# Patient Record
Sex: Female | Born: 1955 | Race: White | Hispanic: Yes | Marital: Single | Smoking: Never smoker
Health system: Southern US, Community
[De-identification: ages and names within clinical notes are randomized; demographics above are authoritative.]

---

## 2005-07-09 ENCOUNTER — Encounter: Admission: RE | Admit: 2005-07-09 | Discharge: 2005-07-09 | Payer: Self-pay | Admitting: Obstetrics and Gynecology

## 2006-10-15 ENCOUNTER — Encounter: Admission: RE | Admit: 2006-10-15 | Discharge: 2006-10-15 | Payer: Self-pay | Admitting: Family Medicine

## 2007-07-15 ENCOUNTER — Emergency Department (HOSPITAL_COMMUNITY): Admission: EM | Admit: 2007-07-15 | Discharge: 2007-07-16 | Payer: Self-pay | Admitting: Emergency Medicine

## 2007-10-16 ENCOUNTER — Encounter: Admission: RE | Admit: 2007-10-16 | Discharge: 2007-10-16 | Payer: Self-pay | Admitting: Family Medicine

## 2008-11-22 ENCOUNTER — Encounter: Admission: RE | Admit: 2008-11-22 | Discharge: 2008-11-22 | Payer: Self-pay | Admitting: Family Medicine

## 2010-05-14 ENCOUNTER — Encounter: Payer: Self-pay | Admitting: Family Medicine

## 2010-06-22 ENCOUNTER — Ambulatory Visit: Payer: Self-pay | Admitting: Family Medicine

## 2010-06-23 ENCOUNTER — Ambulatory Visit: Payer: Self-pay | Admitting: Family Medicine

## 2010-12-21 ENCOUNTER — Other Ambulatory Visit: Payer: Self-pay | Admitting: Family Medicine

## 2010-12-21 DIAGNOSIS — Z1231 Encounter for screening mammogram for malignant neoplasm of breast: Secondary | ICD-10-CM

## 2011-01-08 ENCOUNTER — Ambulatory Visit
Admission: RE | Admit: 2011-01-08 | Discharge: 2011-01-08 | Disposition: A | Discharge status: Home / Self Care | Payer: Federal, State, Local not specified - PPO | Source: Ambulatory Visit | Attending: Family Medicine | Admitting: Family Medicine

## 2011-01-08 DIAGNOSIS — Z1231 Encounter for screening mammogram for malignant neoplasm of breast: Secondary | ICD-10-CM

## 2012-01-08 ENCOUNTER — Other Ambulatory Visit: Payer: Self-pay | Admitting: Family Medicine

## 2012-01-08 DIAGNOSIS — Z1231 Encounter for screening mammogram for malignant neoplasm of breast: Secondary | ICD-10-CM

## 2012-01-21 ENCOUNTER — Ambulatory Visit: Payer: Federal, State, Local not specified - PPO

## 2012-02-15 ENCOUNTER — Ambulatory Visit
Admission: RE | Admit: 2012-02-15 | Discharge: 2012-02-15 | Disposition: A | Discharge status: Home / Self Care | Payer: Federal, State, Local not specified - PPO | Source: Ambulatory Visit | Attending: Family Medicine | Admitting: Family Medicine

## 2012-02-15 DIAGNOSIS — Z1231 Encounter for screening mammogram for malignant neoplasm of breast: Secondary | ICD-10-CM

## 2012-03-17 ENCOUNTER — Ambulatory Visit: Payer: Federal, State, Local not specified - PPO

## 2013-01-22 ENCOUNTER — Other Ambulatory Visit: Payer: Self-pay

## 2013-01-22 DIAGNOSIS — Z1231 Encounter for screening mammogram for malignant neoplasm of breast: Secondary | ICD-10-CM

## 2013-02-16 ENCOUNTER — Ambulatory Visit
Admission: RE | Admit: 2013-02-16 | Discharge: 2013-02-16 | Disposition: A | Discharge status: Home / Self Care | Payer: Federal, State, Local not specified - PPO | Source: Ambulatory Visit

## 2013-02-16 DIAGNOSIS — Z1231 Encounter for screening mammogram for malignant neoplasm of breast: Secondary | ICD-10-CM

## 2013-12-21 ENCOUNTER — Other Ambulatory Visit (HOSPITAL_COMMUNITY)
Admission: RE | Admit: 2013-12-21 | Discharge: 2013-12-21 | Disposition: A | Discharge status: Home / Self Care | Payer: Federal, State, Local not specified - PPO | Source: Ambulatory Visit | Attending: Family Medicine | Admitting: Family Medicine

## 2013-12-21 DIAGNOSIS — Z1151 Encounter for screening for human papillomavirus (HPV): Secondary | ICD-10-CM | POA: Insufficient documentation

## 2013-12-21 DIAGNOSIS — Z124 Encounter for screening for malignant neoplasm of cervix: Secondary | ICD-10-CM | POA: Diagnosis not present

## 2015-09-12 DIAGNOSIS — I10 Essential (primary) hypertension: Secondary | ICD-10-CM | POA: Diagnosis not present

## 2015-09-12 DIAGNOSIS — Z0000 Encounter for general adult medical examination without abnormal findings: Secondary | ICD-10-CM | POA: Diagnosis not present

## 2015-09-12 DIAGNOSIS — Z209 Contact with and (suspected) exposure to unspecified communicable disease: Secondary | ICD-10-CM | POA: Diagnosis not present

## 2015-09-12 DIAGNOSIS — Z23 Encounter for immunization: Secondary | ICD-10-CM | POA: Diagnosis not present

## 2015-09-12 DIAGNOSIS — G43009 Migraine without aura, not intractable, without status migrainosus: Secondary | ICD-10-CM | POA: Diagnosis not present

## 2015-09-12 DIAGNOSIS — Z1211 Encounter for screening for malignant neoplasm of colon: Secondary | ICD-10-CM | POA: Diagnosis not present

## 2015-09-14 ENCOUNTER — Other Ambulatory Visit: Payer: Self-pay

## 2015-09-14 DIAGNOSIS — Z1231 Encounter for screening mammogram for malignant neoplasm of breast: Secondary | ICD-10-CM

## 2015-09-26 ENCOUNTER — Ambulatory Visit: Payer: Federal, State, Local not specified - PPO

## 2015-10-16 DIAGNOSIS — R300 Dysuria: Secondary | ICD-10-CM | POA: Diagnosis not present

## 2015-10-16 DIAGNOSIS — R1900 Intra-abdominal and pelvic swelling, mass and lump, unspecified site: Secondary | ICD-10-CM | POA: Diagnosis not present

## 2015-10-16 DIAGNOSIS — R140 Abdominal distension (gaseous): Secondary | ICD-10-CM | POA: Diagnosis not present

## 2015-10-17 ENCOUNTER — Other Ambulatory Visit: Payer: Self-pay | Admitting: Family Medicine

## 2015-10-18 ENCOUNTER — Other Ambulatory Visit: Payer: Self-pay | Admitting: Family Medicine

## 2015-10-18 DIAGNOSIS — R1900 Intra-abdominal and pelvic swelling, mass and lump, unspecified site: Secondary | ICD-10-CM

## 2015-10-31 ENCOUNTER — Ambulatory Visit
Admission: RE | Admit: 2015-10-31 | Discharge: 2015-10-31 | Disposition: A | Discharge status: Home / Self Care | Payer: Federal, State, Local not specified - PPO | Source: Ambulatory Visit

## 2015-10-31 ENCOUNTER — Other Ambulatory Visit: Payer: Self-pay | Admitting: Family Medicine

## 2015-10-31 ENCOUNTER — Other Ambulatory Visit: Payer: Federal, State, Local not specified - PPO

## 2015-10-31 DIAGNOSIS — Z1231 Encounter for screening mammogram for malignant neoplasm of breast: Secondary | ICD-10-CM

## 2015-11-07 ENCOUNTER — Ambulatory Visit
Admission: RE | Admit: 2015-11-07 | Discharge: 2015-11-07 | Disposition: A | Discharge status: Home / Self Care | Payer: Federal, State, Local not specified - PPO | Source: Ambulatory Visit | Attending: Family Medicine | Admitting: Family Medicine

## 2015-11-07 DIAGNOSIS — K5730 Diverticulosis of large intestine without perforation or abscess without bleeding: Secondary | ICD-10-CM | POA: Diagnosis not present

## 2015-11-07 DIAGNOSIS — R1900 Intra-abdominal and pelvic swelling, mass and lump, unspecified site: Secondary | ICD-10-CM

## 2015-11-07 MED ORDER — IOPAMIDOL (ISOVUE-300) INJECTION 61%
100.0000 mL | Freq: Once | INTRAVENOUS | Status: AC | PRN
  Administered 2015-11-07: 100 mL via INTRAVENOUS

## 2015-11-14 ENCOUNTER — Other Ambulatory Visit: Payer: Self-pay | Admitting: Family Medicine

## 2015-11-14 DIAGNOSIS — K769 Liver disease, unspecified: Secondary | ICD-10-CM

## 2015-12-19 ENCOUNTER — Other Ambulatory Visit: Payer: Federal, State, Local not specified - PPO

## 2016-01-16 ENCOUNTER — Ambulatory Visit
Admission: RE | Admit: 2016-01-16 | Discharge: 2016-01-16 | Disposition: A | Discharge status: Home / Self Care | Payer: Federal, State, Local not specified - PPO | Source: Ambulatory Visit | Attending: Family Medicine | Admitting: Family Medicine

## 2016-01-16 DIAGNOSIS — K769 Liver disease, unspecified: Secondary | ICD-10-CM

## 2016-01-16 DIAGNOSIS — K7689 Other specified diseases of liver: Secondary | ICD-10-CM | POA: Diagnosis not present

## 2016-01-16 MED ORDER — GADOBENATE DIMEGLUMINE 529 MG/ML IV SOLN
16.0000 mL | Freq: Once | INTRAVENOUS | Status: AC | PRN
  Administered 2016-01-16: 16 mL via INTRAVENOUS

## 2016-01-20 DIAGNOSIS — K769 Liver disease, unspecified: Secondary | ICD-10-CM | POA: Diagnosis not present

## 2016-03-05 DIAGNOSIS — I10 Essential (primary) hypertension: Secondary | ICD-10-CM | POA: Diagnosis not present

## 2016-03-05 DIAGNOSIS — E7800 Pure hypercholesterolemia, unspecified: Secondary | ICD-10-CM | POA: Diagnosis not present

## 2016-03-05 DIAGNOSIS — E669 Obesity, unspecified: Secondary | ICD-10-CM | POA: Diagnosis not present

## 2016-03-05 DIAGNOSIS — G43009 Migraine without aura, not intractable, without status migrainosus: Secondary | ICD-10-CM | POA: Diagnosis not present

## 2016-03-05 DIAGNOSIS — R739 Hyperglycemia, unspecified: Secondary | ICD-10-CM | POA: Diagnosis not present

## 2016-06-18 DIAGNOSIS — G43009 Migraine without aura, not intractable, without status migrainosus: Secondary | ICD-10-CM | POA: Diagnosis not present

## 2016-06-18 DIAGNOSIS — I10 Essential (primary) hypertension: Secondary | ICD-10-CM | POA: Diagnosis not present

## 2016-06-18 DIAGNOSIS — M722 Plantar fascial fibromatosis: Secondary | ICD-10-CM | POA: Diagnosis not present

## 2016-09-10 DIAGNOSIS — R739 Hyperglycemia, unspecified: Secondary | ICD-10-CM | POA: Diagnosis not present

## 2016-09-10 DIAGNOSIS — M5416 Radiculopathy, lumbar region: Secondary | ICD-10-CM | POA: Diagnosis not present

## 2016-09-10 DIAGNOSIS — E7800 Pure hypercholesterolemia, unspecified: Secondary | ICD-10-CM | POA: Diagnosis not present

## 2016-09-10 DIAGNOSIS — I10 Essential (primary) hypertension: Secondary | ICD-10-CM | POA: Diagnosis not present

## 2016-09-24 DIAGNOSIS — M5431 Sciatica, right side: Secondary | ICD-10-CM | POA: Diagnosis not present

## 2016-09-24 DIAGNOSIS — L608 Other nail disorders: Secondary | ICD-10-CM | POA: Diagnosis not present

## 2016-10-19 ENCOUNTER — Other Ambulatory Visit: Payer: Self-pay | Admitting: Family Medicine

## 2016-10-19 DIAGNOSIS — Z1231 Encounter for screening mammogram for malignant neoplasm of breast: Secondary | ICD-10-CM

## 2016-11-05 DIAGNOSIS — I10 Essential (primary) hypertension: Secondary | ICD-10-CM | POA: Diagnosis not present

## 2016-11-05 DIAGNOSIS — E7800 Pure hypercholesterolemia, unspecified: Secondary | ICD-10-CM | POA: Diagnosis not present

## 2016-11-05 DIAGNOSIS — G43009 Migraine without aura, not intractable, without status migrainosus: Secondary | ICD-10-CM | POA: Diagnosis not present

## 2016-11-05 DIAGNOSIS — R739 Hyperglycemia, unspecified: Secondary | ICD-10-CM | POA: Diagnosis not present

## 2016-11-05 DIAGNOSIS — Z0000 Encounter for general adult medical examination without abnormal findings: Secondary | ICD-10-CM | POA: Diagnosis not present

## 2016-11-06 ENCOUNTER — Other Ambulatory Visit: Payer: Self-pay | Admitting: Family Medicine

## 2016-11-06 DIAGNOSIS — K769 Liver disease, unspecified: Secondary | ICD-10-CM

## 2016-11-12 ENCOUNTER — Ambulatory Visit: Payer: Federal, State, Local not specified - PPO

## 2016-11-27 ENCOUNTER — Other Ambulatory Visit: Payer: Federal, State, Local not specified - PPO

## 2016-12-03 ENCOUNTER — Ambulatory Visit
Admission: RE | Admit: 2016-12-03 | Discharge: 2016-12-03 | Disposition: A | Discharge status: Home / Self Care | Payer: Federal, State, Local not specified - PPO | Source: Ambulatory Visit | Attending: Family Medicine | Admitting: Family Medicine

## 2016-12-03 ENCOUNTER — Other Ambulatory Visit: Payer: Self-pay | Admitting: Family Medicine

## 2016-12-03 DIAGNOSIS — K769 Liver disease, unspecified: Secondary | ICD-10-CM

## 2016-12-03 DIAGNOSIS — Z1231 Encounter for screening mammogram for malignant neoplasm of breast: Secondary | ICD-10-CM

## 2016-12-10 DIAGNOSIS — M25561 Pain in right knee: Secondary | ICD-10-CM | POA: Diagnosis not present

## 2016-12-10 DIAGNOSIS — M5416 Radiculopathy, lumbar region: Secondary | ICD-10-CM | POA: Diagnosis not present

## 2016-12-10 DIAGNOSIS — Z6820 Body mass index (BMI) 20.0-20.9, adult: Secondary | ICD-10-CM | POA: Diagnosis not present

## 2016-12-19 DIAGNOSIS — K769 Liver disease, unspecified: Secondary | ICD-10-CM | POA: Diagnosis not present

## 2017-11-11 DIAGNOSIS — I10 Essential (primary) hypertension: Secondary | ICD-10-CM | POA: Diagnosis not present

## 2017-11-11 DIAGNOSIS — E7800 Pure hypercholesterolemia, unspecified: Secondary | ICD-10-CM | POA: Diagnosis not present

## 2017-11-11 DIAGNOSIS — R7303 Prediabetes: Secondary | ICD-10-CM | POA: Diagnosis not present

## 2017-11-27 DIAGNOSIS — E669 Obesity, unspecified: Secondary | ICD-10-CM | POA: Diagnosis not present

## 2017-11-27 DIAGNOSIS — I10 Essential (primary) hypertension: Secondary | ICD-10-CM | POA: Diagnosis not present

## 2017-11-27 DIAGNOSIS — G43009 Migraine without aura, not intractable, without status migrainosus: Secondary | ICD-10-CM | POA: Diagnosis not present

## 2017-11-27 DIAGNOSIS — I700 Atherosclerosis of aorta: Secondary | ICD-10-CM | POA: Diagnosis not present

## 2017-11-27 DIAGNOSIS — Z0000 Encounter for general adult medical examination without abnormal findings: Secondary | ICD-10-CM | POA: Diagnosis not present

## 2018-04-17 DIAGNOSIS — Z1231 Encounter for screening mammogram for malignant neoplasm of breast: Secondary | ICD-10-CM | POA: Diagnosis not present

## 2018-04-28 IMAGING — US US ABDOMEN LIMITED
1 series · 13 of 25 positions shown · non-contrast
Comparison: MRI 01/16/2016.  CT 11/07/2015 .

CLINICAL DATA: Liver lesion .

EXAM:
ULTRASOUND ABDOMEN LIMITED RIGHT UPPER QUADRANT

[Series 1: us abdomen limited · 0.13mm/px · 13 of 64 slices shown]
[im 1/64]
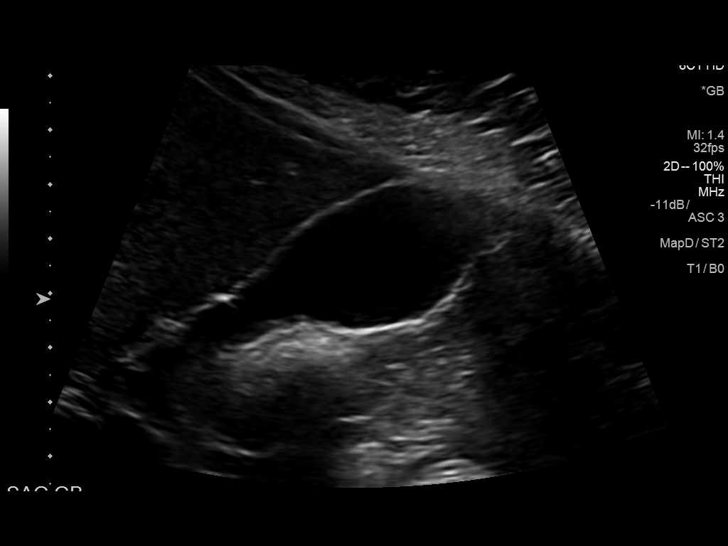
[im 6/64]
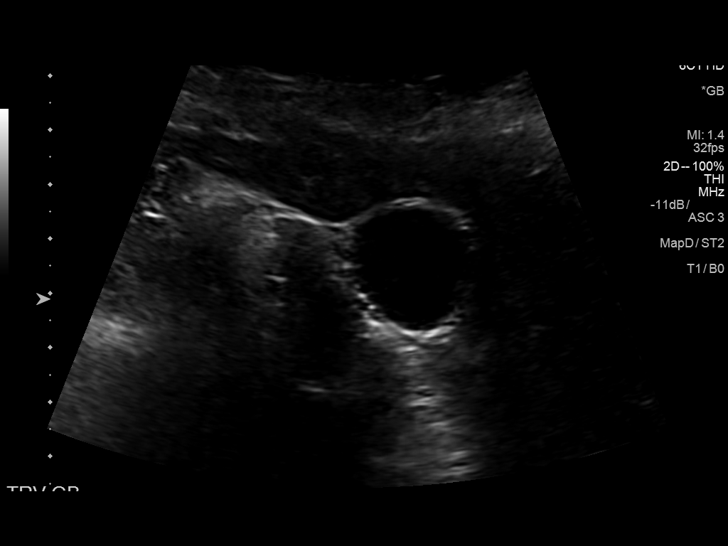
[im 11/64]
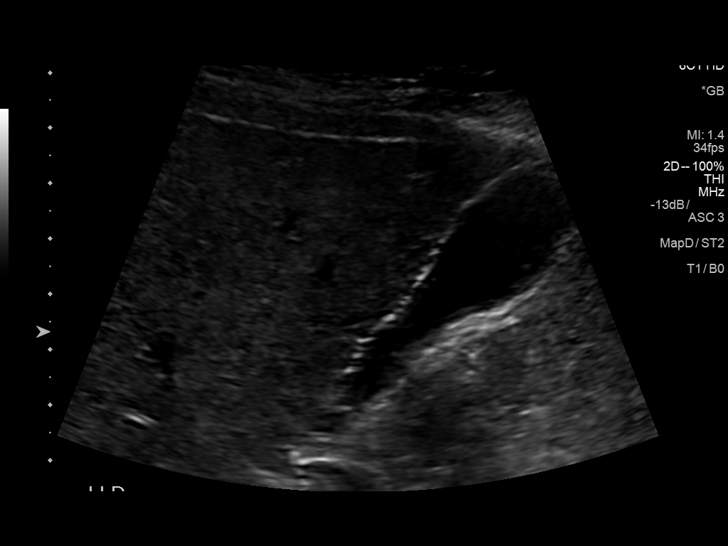
[im 16/64]
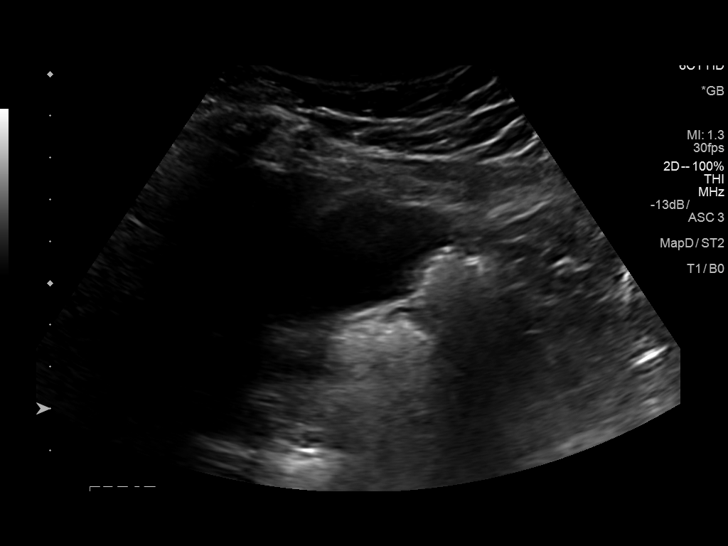
[im 22/64]
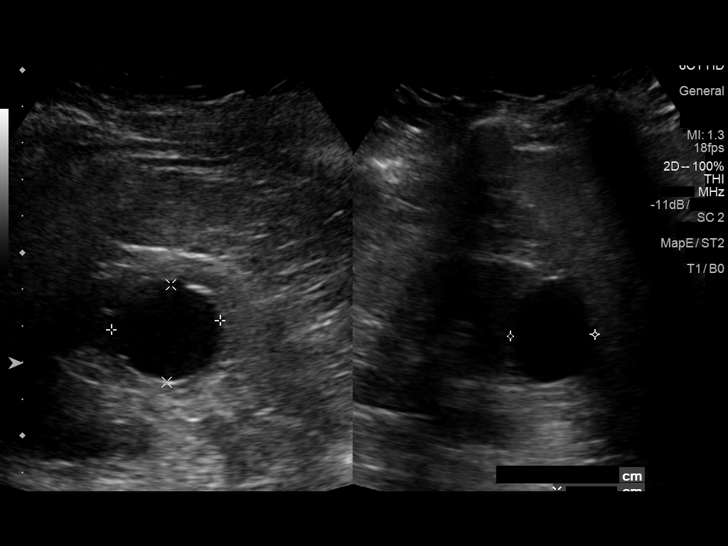
[im 27/64]
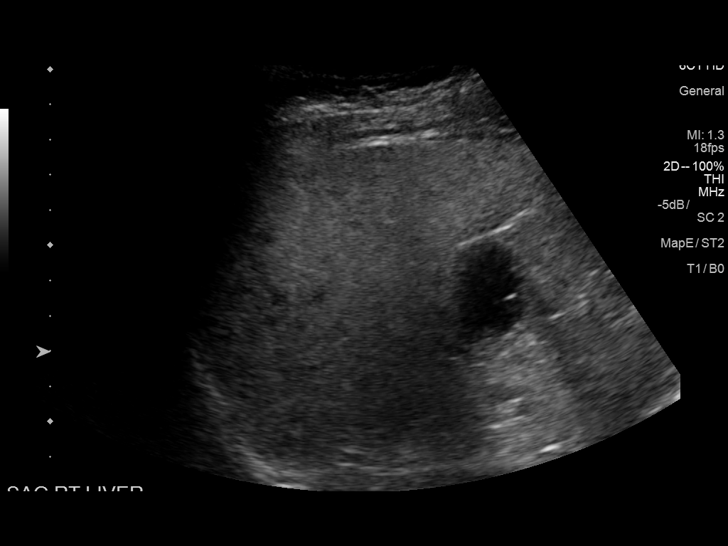
[im 32/64]
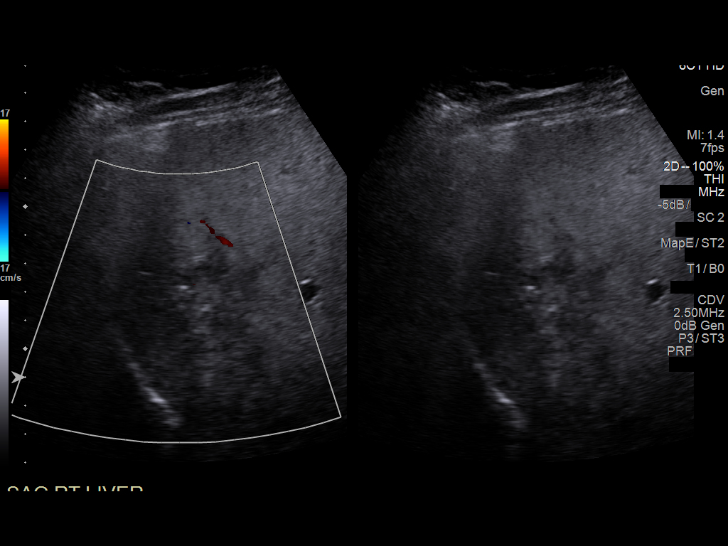
[im 37/64]
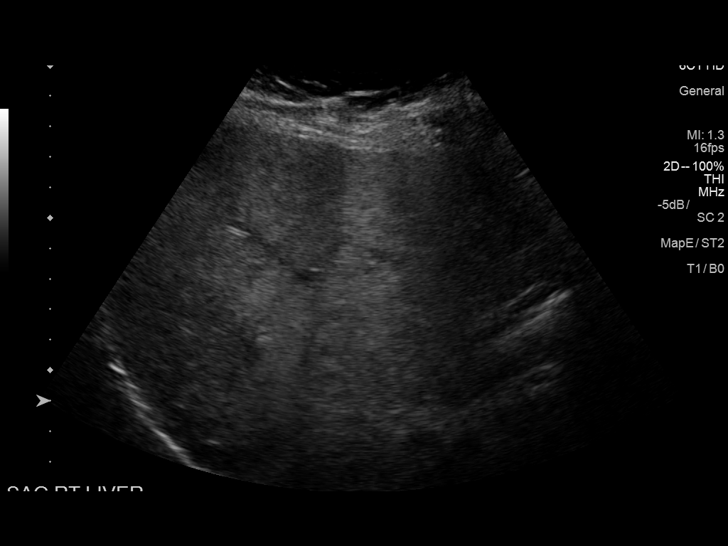
[im 43/64]
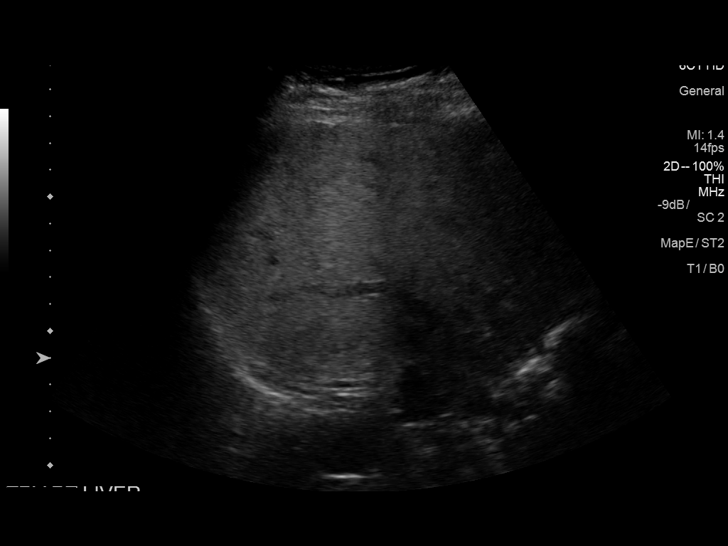
[im 48/64]
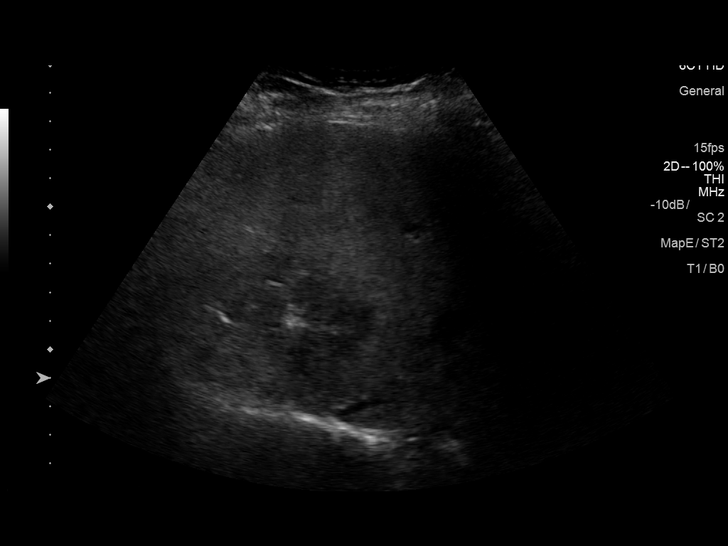
[im 53/64]
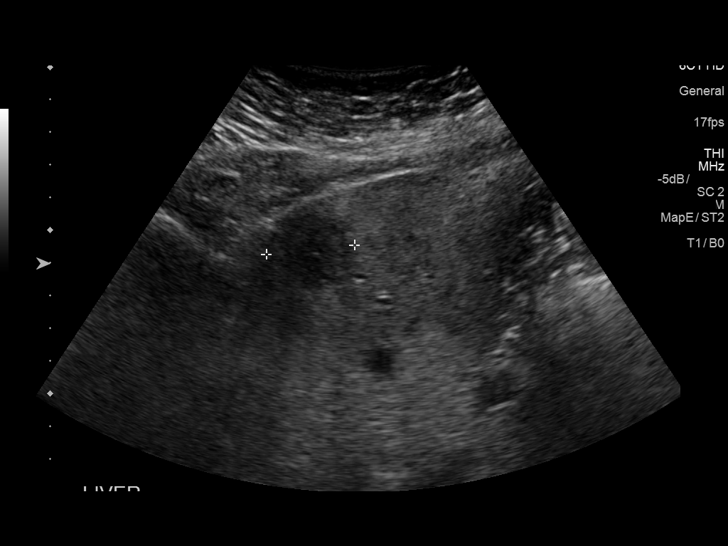
[im 58/64]
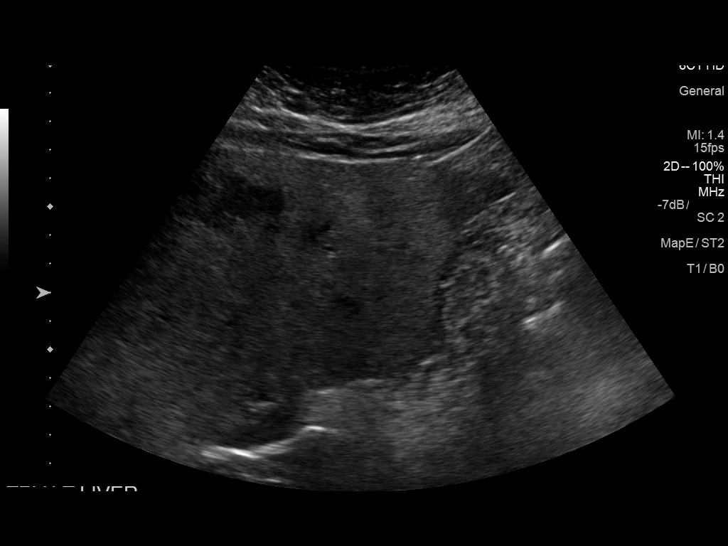
[im 64/64]
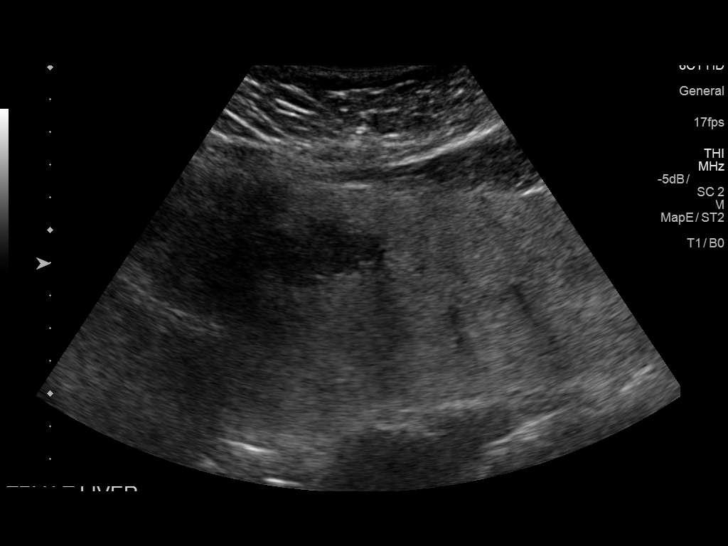

[13 of 25 positions shown; findings below may reference images not displayed]

FINDINGS: Gallbladder:

No gallstones or wall thickening visualized. No sonographic Murphy
sign noted by sonographer.

Common bile duct:

Diameter: 5.3 mm

Liver:

Liver has a heterogeneous parenchymal pattern suggesting fatty
infiltration and/or hepatocellular disease. A 5.7 x 5.9 x 5.6 cm
heterogeneous mass is again noted in the right lobe liver. Punctate
areas of increased echogenicity noted within the mass. Questionable
associated shadowing. Although these punctate areas may represent
fat, calcifications within the lesion cannot be excluded . No lesion
calcification previously noted on prior studies. A 2.7 x 4.2 x
cm heterogeneous mass is again noted in the left lobe of the liver.
No significant change in size of either lesion noted.
IMPRESSION: 1. A 5.7 x 5.9 x 5.6 cm heterogeneous mass is again in the right
lobe liver. On today's exam there punctate areas of increased
echogenicity within the mass. Associated questionable shadowing is
noted. Although these punctate areas of increase echogenicity may
represent fat, calcification within this lesion on today's exam
cannot be excluded. Again correlate with AFP levels. A follow-up
gadolinium-enhanced MRI can be obtained to compare to the prior MR
exam of 01/16/2016. [DATE] x 4.2 x 2.2 cm left hepatic lobe lesion is
again noted. No interim size change noted in either lesion.

2. The liver has a heterogeneous parenchymal pattern suggesting
fatty infiltration and/or hepatocellular disease.

3.  No gallstones or biliary distention.

## 2018-06-02 DIAGNOSIS — E7800 Pure hypercholesterolemia, unspecified: Secondary | ICD-10-CM | POA: Diagnosis not present

## 2018-06-02 DIAGNOSIS — G43009 Migraine without aura, not intractable, without status migrainosus: Secondary | ICD-10-CM | POA: Diagnosis not present

## 2018-06-02 DIAGNOSIS — R739 Hyperglycemia, unspecified: Secondary | ICD-10-CM | POA: Diagnosis not present

## 2018-06-02 DIAGNOSIS — I10 Essential (primary) hypertension: Secondary | ICD-10-CM | POA: Diagnosis not present

## 2018-06-05 DIAGNOSIS — Z1211 Encounter for screening for malignant neoplasm of colon: Secondary | ICD-10-CM | POA: Diagnosis not present

## 2018-12-22 ENCOUNTER — Other Ambulatory Visit: Payer: Self-pay | Admitting: Family Medicine

## 2018-12-22 ENCOUNTER — Other Ambulatory Visit (HOSPITAL_COMMUNITY)
Admission: RE | Admit: 2018-12-22 | Discharge: 2018-12-22 | Disposition: A | Discharge status: Home / Self Care | Payer: Federal, State, Local not specified - PPO | Source: Ambulatory Visit | Attending: Family Medicine | Admitting: Family Medicine

## 2018-12-22 DIAGNOSIS — Z0000 Encounter for general adult medical examination without abnormal findings: Secondary | ICD-10-CM | POA: Diagnosis not present

## 2018-12-22 DIAGNOSIS — E7800 Pure hypercholesterolemia, unspecified: Secondary | ICD-10-CM | POA: Diagnosis not present

## 2018-12-22 DIAGNOSIS — G43009 Migraine without aura, not intractable, without status migrainosus: Secondary | ICD-10-CM | POA: Diagnosis not present

## 2018-12-22 DIAGNOSIS — R739 Hyperglycemia, unspecified: Secondary | ICD-10-CM | POA: Diagnosis not present

## 2018-12-22 DIAGNOSIS — E669 Obesity, unspecified: Secondary | ICD-10-CM | POA: Diagnosis not present

## 2018-12-22 DIAGNOSIS — I10 Essential (primary) hypertension: Secondary | ICD-10-CM | POA: Diagnosis not present

## 2018-12-22 DIAGNOSIS — Z124 Encounter for screening for malignant neoplasm of cervix: Secondary | ICD-10-CM | POA: Diagnosis not present

## 2018-12-24 LAB — CYTOLOGY - PAP
Diagnosis: NEGATIVE
HPV: NOT DETECTED

## 2019-01-08 ENCOUNTER — Ambulatory Visit: Payer: Federal, State, Local not specified - PPO | Admitting: Podiatry

## 2019-01-09 ENCOUNTER — Ambulatory Visit: Payer: Federal, State, Local not specified - PPO | Admitting: Podiatry

## 2019-01-13 ENCOUNTER — Ambulatory Visit: Payer: Federal, State, Local not specified - PPO | Admitting: Podiatry

## 2019-01-26 ENCOUNTER — Other Ambulatory Visit: Payer: Self-pay

## 2019-01-26 ENCOUNTER — Ambulatory Visit: Payer: Federal, State, Local not specified - PPO | Admitting: Podiatry

## 2019-01-26 ENCOUNTER — Encounter: Payer: Self-pay | Admitting: Podiatry

## 2019-01-26 VITALS — BP 151/87

## 2019-01-26 DIAGNOSIS — L600 Ingrowing nail: Secondary | ICD-10-CM

## 2019-01-26 DIAGNOSIS — B351 Tinea unguium: Secondary | ICD-10-CM

## 2019-02-01 NOTE — Progress Notes (Signed)
Subjective:   Patient ID: Cynthia Kerr, female   DOB: 63 y.o.   MRN: [id number]   HPI Patient presents concerned about a cracking of the right hallux nail and whether or not this will get worse over time.  Patient does not smoke   Review of Systems  All other systems reviewed and are negative.       Objective:  Physical Exam Vitals signs and nursing note reviewed.  Constitutional:      Appearance: She is well-developed.  Pulmonary:     Effort: Pulmonary effort is normal.  Musculoskeletal: Normal range of motion.  Skin:    General: Skin is warm.  Neurological:     Mental Status: She is alert.     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  Patient will continue to allow it to grow out and see that it does not get her problems but is encouraged to come in if any issues were to occur

## 2019-06-09 DIAGNOSIS — M545 Low back pain: Secondary | ICD-10-CM | POA: Diagnosis not present

## 2019-07-09 DIAGNOSIS — T24299A Burn of second degree of multiple sites of unspecified lower limb, except ankle and foot, initial encounter: Secondary | ICD-10-CM | POA: Diagnosis not present

## 2019-07-13 DIAGNOSIS — T24299A Burn of second degree of multiple sites of unspecified lower limb, except ankle and foot, initial encounter: Secondary | ICD-10-CM | POA: Diagnosis not present

## 2019-07-16 DIAGNOSIS — T24299A Burn of second degree of multiple sites of unspecified lower limb, except ankle and foot, initial encounter: Secondary | ICD-10-CM | POA: Diagnosis not present

## 2019-09-07 ENCOUNTER — Other Ambulatory Visit: Payer: Self-pay | Admitting: Family Medicine

## 2019-09-07 DIAGNOSIS — Z1231 Encounter for screening mammogram for malignant neoplasm of breast: Secondary | ICD-10-CM

## 2019-09-11 ENCOUNTER — Ambulatory Visit: Payer: Federal, State, Local not specified - PPO

## 2019-09-11 DIAGNOSIS — Z1231 Encounter for screening mammogram for malignant neoplasm of breast: Secondary | ICD-10-CM | POA: Diagnosis not present

## 2020-01-04 DIAGNOSIS — Z0000 Encounter for general adult medical examination without abnormal findings: Secondary | ICD-10-CM | POA: Diagnosis not present

## 2020-01-04 DIAGNOSIS — E7800 Pure hypercholesterolemia, unspecified: Secondary | ICD-10-CM | POA: Diagnosis not present

## 2020-01-04 DIAGNOSIS — R7303 Prediabetes: Secondary | ICD-10-CM | POA: Diagnosis not present

## 2020-01-04 DIAGNOSIS — I700 Atherosclerosis of aorta: Secondary | ICD-10-CM | POA: Diagnosis not present

## 2020-01-04 DIAGNOSIS — G43009 Migraine without aura, not intractable, without status migrainosus: Secondary | ICD-10-CM | POA: Diagnosis not present

## 2020-01-04 DIAGNOSIS — I10 Essential (primary) hypertension: Secondary | ICD-10-CM | POA: Diagnosis not present

## 2020-01-05 ENCOUNTER — Other Ambulatory Visit: Payer: Self-pay | Admitting: Family Medicine

## 2020-01-05 DIAGNOSIS — K769 Liver disease, unspecified: Secondary | ICD-10-CM

## 2020-02-19 ENCOUNTER — Other Ambulatory Visit: Payer: Federal, State, Local not specified - PPO

## 2020-03-07 ENCOUNTER — Other Ambulatory Visit: Payer: Federal, State, Local not specified - PPO

## 2020-03-14 ENCOUNTER — Ambulatory Visit
Admission: RE | Admit: 2020-03-14 | Discharge: 2020-03-14 | Disposition: A | Discharge status: Home / Self Care | Payer: Federal, State, Local not specified - PPO | Source: Ambulatory Visit | Attending: Family Medicine | Admitting: Family Medicine

## 2020-03-14 DIAGNOSIS — K769 Liver disease, unspecified: Secondary | ICD-10-CM

## 2020-03-24 DIAGNOSIS — M5442 Lumbago with sciatica, left side: Secondary | ICD-10-CM | POA: Diagnosis not present

## 2020-03-24 DIAGNOSIS — M5441 Lumbago with sciatica, right side: Secondary | ICD-10-CM | POA: Diagnosis not present

## 2020-09-09 ENCOUNTER — Other Ambulatory Visit: Payer: Self-pay | Admitting: Family Medicine

## 2020-09-09 DIAGNOSIS — Z1231 Encounter for screening mammogram for malignant neoplasm of breast: Secondary | ICD-10-CM

## 2020-09-26 DIAGNOSIS — Z1231 Encounter for screening mammogram for malignant neoplasm of breast: Secondary | ICD-10-CM | POA: Diagnosis not present

## 2020-11-14 ENCOUNTER — Ambulatory Visit: Payer: Federal, State, Local not specified - PPO

## 2020-12-09 DIAGNOSIS — M545 Low back pain, unspecified: Secondary | ICD-10-CM | POA: Diagnosis not present

## 2020-12-09 DIAGNOSIS — I10 Essential (primary) hypertension: Secondary | ICD-10-CM | POA: Diagnosis not present

## 2021-01-16 DIAGNOSIS — G43009 Migraine without aura, not intractable, without status migrainosus: Secondary | ICD-10-CM | POA: Diagnosis not present

## 2021-01-16 DIAGNOSIS — I10 Essential (primary) hypertension: Secondary | ICD-10-CM | POA: Diagnosis not present

## 2021-01-16 DIAGNOSIS — E7800 Pure hypercholesterolemia, unspecified: Secondary | ICD-10-CM | POA: Diagnosis not present

## 2021-01-16 DIAGNOSIS — E669 Obesity, unspecified: Secondary | ICD-10-CM | POA: Diagnosis not present

## 2021-01-16 DIAGNOSIS — Z0000 Encounter for general adult medical examination without abnormal findings: Secondary | ICD-10-CM | POA: Diagnosis not present

## 2021-01-16 DIAGNOSIS — R7303 Prediabetes: Secondary | ICD-10-CM | POA: Diagnosis not present

## 2021-03-02 DIAGNOSIS — K635 Polyp of colon: Secondary | ICD-10-CM | POA: Diagnosis not present

## 2021-03-02 DIAGNOSIS — D123 Benign neoplasm of transverse colon: Secondary | ICD-10-CM | POA: Diagnosis not present

## 2021-03-02 DIAGNOSIS — I10 Essential (primary) hypertension: Secondary | ICD-10-CM | POA: Diagnosis not present

## 2021-03-02 DIAGNOSIS — Z1211 Encounter for screening for malignant neoplasm of colon: Secondary | ICD-10-CM | POA: Diagnosis not present

## 2021-03-02 DIAGNOSIS — K5730 Diverticulosis of large intestine without perforation or abscess without bleeding: Secondary | ICD-10-CM | POA: Diagnosis not present

## 2021-03-02 DIAGNOSIS — Z8371 Family history of colonic polyps: Secondary | ICD-10-CM | POA: Diagnosis not present

## 2021-10-11 DIAGNOSIS — Z1231 Encounter for screening mammogram for malignant neoplasm of breast: Secondary | ICD-10-CM | POA: Diagnosis not present

## 2021-10-19 DIAGNOSIS — R922 Inconclusive mammogram: Secondary | ICD-10-CM | POA: Diagnosis not present

## 2021-10-19 DIAGNOSIS — R928 Other abnormal and inconclusive findings on diagnostic imaging of breast: Secondary | ICD-10-CM | POA: Diagnosis not present

## 2022-01-22 DIAGNOSIS — I10 Essential (primary) hypertension: Secondary | ICD-10-CM | POA: Diagnosis not present

## 2022-01-22 DIAGNOSIS — R7303 Prediabetes: Secondary | ICD-10-CM | POA: Diagnosis not present

## 2022-01-22 DIAGNOSIS — Z0000 Encounter for general adult medical examination without abnormal findings: Secondary | ICD-10-CM | POA: Diagnosis not present

## 2022-01-22 DIAGNOSIS — G43009 Migraine without aura, not intractable, without status migrainosus: Secondary | ICD-10-CM | POA: Diagnosis not present

## 2022-01-22 DIAGNOSIS — I700 Atherosclerosis of aorta: Secondary | ICD-10-CM | POA: Diagnosis not present

## 2022-01-22 DIAGNOSIS — Z780 Asymptomatic menopausal state: Secondary | ICD-10-CM | POA: Diagnosis not present

## 2022-02-16 DIAGNOSIS — Z780 Asymptomatic menopausal state: Secondary | ICD-10-CM | POA: Diagnosis not present

## 2022-06-07 DIAGNOSIS — R059 Cough, unspecified: Secondary | ICD-10-CM | POA: Diagnosis not present

## 2022-06-07 DIAGNOSIS — R52 Pain, unspecified: Secondary | ICD-10-CM | POA: Diagnosis not present

## 2022-06-07 DIAGNOSIS — R5383 Other fatigue: Secondary | ICD-10-CM | POA: Diagnosis not present

## 2022-06-07 DIAGNOSIS — U071 COVID-19: Secondary | ICD-10-CM | POA: Diagnosis not present

## 2022-10-23 DIAGNOSIS — Z1231 Encounter for screening mammogram for malignant neoplasm of breast: Secondary | ICD-10-CM | POA: Diagnosis not present

## 2023-01-28 DIAGNOSIS — I10 Essential (primary) hypertension: Secondary | ICD-10-CM | POA: Diagnosis not present

## 2023-01-28 DIAGNOSIS — Z23 Encounter for immunization: Secondary | ICD-10-CM | POA: Diagnosis not present

## 2023-01-28 DIAGNOSIS — Z0000 Encounter for general adult medical examination without abnormal findings: Secondary | ICD-10-CM | POA: Diagnosis not present

## 2023-01-28 DIAGNOSIS — Z5181 Encounter for therapeutic drug level monitoring: Secondary | ICD-10-CM | POA: Diagnosis not present

## 2023-01-28 DIAGNOSIS — Z6832 Body mass index (BMI) 32.0-32.9, adult: Secondary | ICD-10-CM | POA: Diagnosis not present

## 2023-01-28 DIAGNOSIS — E7800 Pure hypercholesterolemia, unspecified: Secondary | ICD-10-CM | POA: Diagnosis not present

## 2023-01-28 DIAGNOSIS — G43009 Migraine without aura, not intractable, without status migrainosus: Secondary | ICD-10-CM | POA: Diagnosis not present

## 2023-01-28 DIAGNOSIS — R7303 Prediabetes: Secondary | ICD-10-CM | POA: Diagnosis not present

## 2023-01-28 DIAGNOSIS — E669 Obesity, unspecified: Secondary | ICD-10-CM | POA: Diagnosis not present
